# Patient Record
Sex: Female | Born: 1950 | Race: White | Hispanic: No | Marital: Married | Smoking: Never smoker
Health system: Southern US, Community
[De-identification: ages and names within clinical notes are randomized; demographics above are authoritative.]

## PROBLEM LIST (undated history)

## (undated) DIAGNOSIS — M810 Age-related osteoporosis without current pathological fracture: Secondary | ICD-10-CM

## (undated) DIAGNOSIS — Z789 Other specified health status: Secondary | ICD-10-CM

## (undated) HISTORY — PX: WRIST FRACTURE SURGERY: SHX121

## (undated) HISTORY — DX: Age-related osteoporosis without current pathological fracture: M81.0

## (undated) HISTORY — PX: DILATION AND CURETTAGE OF UTERUS: SHX78

---

## 2005-01-13 ENCOUNTER — Ambulatory Visit: Payer: Self-pay | Admitting: Obstetrics and Gynecology

## 2007-01-11 ENCOUNTER — Ambulatory Visit: Payer: Self-pay | Admitting: Obstetrics and Gynecology

## 2008-05-19 ENCOUNTER — Ambulatory Visit: Payer: Self-pay | Admitting: Family Medicine

## 2009-06-12 ENCOUNTER — Inpatient Hospital Stay (HOSPITAL_COMMUNITY): Admission: EM | Admit: 2009-06-12 | Discharge: 2009-06-14 | Payer: Self-pay | Admitting: Emergency Medicine

## 2009-06-12 ENCOUNTER — Encounter: Payer: Self-pay | Admitting: Emergency Medicine

## 2009-10-15 ENCOUNTER — Ambulatory Visit: Payer: Self-pay | Admitting: Family Medicine

## 2009-10-27 ENCOUNTER — Ambulatory Visit: Payer: Self-pay | Admitting: Family Medicine

## 2010-04-29 ENCOUNTER — Ambulatory Visit: Payer: Self-pay | Admitting: Family Medicine

## 2010-10-27 LAB — DIFFERENTIAL
Eosinophils Relative: 1 % (ref 0–5)
Lymphocytes Relative: 15 % (ref 12–46)
Lymphs Abs: 1.2 10*3/uL (ref 0.7–4.0)
Monocytes Absolute: 0.4 10*3/uL (ref 0.1–1.0)
Monocytes Relative: 4 % (ref 3–12)

## 2010-10-27 LAB — CBC
HCT: 38.4 % (ref 36.0–46.0)
Hemoglobin: 13.2 g/dL (ref 12.0–15.0)
RBC: 4.41 MIL/uL (ref 3.87–5.11)
RDW: 12.9 % (ref 11.5–15.5)
WBC: 8.5 10*3/uL (ref 4.0–10.5)

## 2010-10-27 LAB — BASIC METABOLIC PANEL
GFR calc Af Amer: >60 mL/min (ref >60)
GFR calc non Af Amer: >60 mL/min (ref >60)
Glucose, Bld: 113 mg/dL — ABNORMAL HIGH (ref 70–99)
Potassium: 3.6 mEq/L (ref 3.5–5.1)
Sodium: 140 mEq/L (ref 135–145)

## 2010-10-27 LAB — URINALYSIS, ROUTINE W REFLEX MICROSCOPIC
Bilirubin Urine: NEGATIVE
Hgb urine dipstick: NEGATIVE
Specific Gravity, Urine: 1.01 (ref 1.005–1.030)
Urobilinogen, UA: 0.2 mg/dL (ref 0.0–1.0)

## 2010-10-27 LAB — URINE CULTURE: Colony Count: 50000

## 2010-10-27 LAB — URINE MICROSCOPIC-ADD ON

## 2011-01-19 ENCOUNTER — Other Ambulatory Visit (HOSPITAL_COMMUNITY): Payer: Self-pay | Admitting: Internal Medicine

## 2011-01-19 DIAGNOSIS — Z01419 Encounter for gynecological examination (general) (routine) without abnormal findings: Secondary | ICD-10-CM

## 2011-01-24 ENCOUNTER — Other Ambulatory Visit (HOSPITAL_COMMUNITY): Payer: Self-pay

## 2011-01-28 ENCOUNTER — Ambulatory Visit (HOSPITAL_COMMUNITY)
Admission: RE | Admit: 2011-01-28 | Discharge: 2011-01-28 | Disposition: A | Discharge status: Home / Self Care | Payer: 59 | Source: Ambulatory Visit | Attending: Internal Medicine | Admitting: Internal Medicine

## 2011-01-28 DIAGNOSIS — Z780 Asymptomatic menopausal state: Secondary | ICD-10-CM | POA: Insufficient documentation

## 2011-01-28 DIAGNOSIS — Z13820 Encounter for screening for osteoporosis: Secondary | ICD-10-CM | POA: Insufficient documentation

## 2011-01-28 DIAGNOSIS — M818 Other osteoporosis without current pathological fracture: Secondary | ICD-10-CM | POA: Insufficient documentation

## 2011-01-28 DIAGNOSIS — Z01419 Encounter for gynecological examination (general) (routine) without abnormal findings: Secondary | ICD-10-CM

## 2011-06-03 ENCOUNTER — Other Ambulatory Visit (HOSPITAL_COMMUNITY)
Admission: RE | Admit: 2011-06-03 | Discharge: 2011-06-03 | Disposition: A | Discharge status: Home / Self Care | Payer: 59 | Source: Ambulatory Visit | Attending: Obstetrics & Gynecology | Admitting: Obstetrics & Gynecology

## 2011-06-03 DIAGNOSIS — Z01419 Encounter for gynecological examination (general) (routine) without abnormal findings: Secondary | ICD-10-CM | POA: Insufficient documentation

## 2011-06-15 ENCOUNTER — Ambulatory Visit: Payer: Self-pay

## 2012-12-12 ENCOUNTER — Encounter (INDEPENDENT_AMBULATORY_CARE_PROVIDER_SITE_OTHER): Payer: Self-pay | Admitting: *Deleted

## 2012-12-13 ENCOUNTER — Encounter: Payer: Self-pay | Admitting: Family Medicine

## 2012-12-19 ENCOUNTER — Other Ambulatory Visit (INDEPENDENT_AMBULATORY_CARE_PROVIDER_SITE_OTHER): Payer: Self-pay | Admitting: *Deleted

## 2012-12-19 ENCOUNTER — Telehealth (INDEPENDENT_AMBULATORY_CARE_PROVIDER_SITE_OTHER): Payer: Self-pay | Admitting: *Deleted

## 2012-12-19 ENCOUNTER — Encounter (INDEPENDENT_AMBULATORY_CARE_PROVIDER_SITE_OTHER): Payer: Self-pay | Admitting: *Deleted

## 2012-12-19 DIAGNOSIS — Z1211 Encounter for screening for malignant neoplasm of colon: Secondary | ICD-10-CM

## 2012-12-19 LAB — HM COLONOSCOPY

## 2012-12-19 NOTE — Telephone Encounter (Signed)
Patient needs movi prep 

## 2012-12-20 MED ORDER — PEG-KCL-NACL-NASULF-NA ASC-C 100 G PO SOLR: 1.0000 | Freq: Once | ORAL | Status: DC

## 2013-01-08 ENCOUNTER — Telehealth (INDEPENDENT_AMBULATORY_CARE_PROVIDER_SITE_OTHER): Payer: Self-pay | Admitting: *Deleted

## 2013-01-08 NOTE — Telephone Encounter (Signed)
agree

## 2013-01-08 NOTE — Telephone Encounter (Signed)
  Procedure: tcs  Reason/Indication:  screening  Has patient had this procedure before?  Yes, 10 yrs ago  If so, when, by whom and where?    Is there a family history of colon cancer?  Not sure  Who?  What age when diagnosed?    Is patient diabetic?   no      Does patient have prosthetic heart valve?  no  Do you have a pacemaker?  no  Has patient ever had endocarditis? no  Has patient had joint replacement within last 12 months?  no  Is patient on Coumadin, Plavix and/or Aspirin? no  Medications: ulta omega daily, co q 10 daily, one a day vit daily, calcium w/ vit d3 daily  Allergies: clindamycin  Medication Adjustment:   Procedure date & time: 02/06/13 at 930

## 2013-01-18 ENCOUNTER — Encounter (HOSPITAL_COMMUNITY): Payer: Self-pay | Admitting: Pharmacy Technician

## 2013-02-06 ENCOUNTER — Ambulatory Visit (HOSPITAL_COMMUNITY)
Admission: RE | Admit: 2013-02-06 | Discharge: 2013-02-06 | Disposition: A | Discharge status: Home / Self Care | Payer: 59 | Source: Ambulatory Visit | Attending: Internal Medicine | Admitting: Internal Medicine

## 2013-02-06 ENCOUNTER — Encounter (HOSPITAL_COMMUNITY)
Admission: RE | Disposition: A | Discharge status: Home / Self Care | Payer: Self-pay | Source: Ambulatory Visit | Attending: Internal Medicine

## 2013-02-06 ENCOUNTER — Encounter (HOSPITAL_COMMUNITY): Payer: Self-pay

## 2013-02-06 DIAGNOSIS — Z1211 Encounter for screening for malignant neoplasm of colon: Secondary | ICD-10-CM

## 2013-02-06 DIAGNOSIS — K5730 Diverticulosis of large intestine without perforation or abscess without bleeding: Secondary | ICD-10-CM | POA: Insufficient documentation

## 2013-02-06 HISTORY — PX: COLONOSCOPY: SHX5424

## 2013-02-06 HISTORY — DX: Other specified health status: Z78.9

## 2013-02-06 SURGERY — COLONOSCOPY
Anesthesia: Moderate Sedation

## 2013-02-06 MED ORDER — SODIUM CHLORIDE 0.9 % IV SOLN
INTRAVENOUS | Status: DC
  Administered 2013-02-06: 09:00:00 via INTRAVENOUS

## 2013-02-06 MED ORDER — MEPERIDINE HCL 50 MG/ML IJ SOLN
INTRAMUSCULAR | Status: DC | PRN
  Administered 2013-02-06 (×2): 25 mg via INTRAVENOUS

## 2013-02-06 MED ORDER — STERILE WATER FOR IRRIGATION IR SOLN
Status: DC | PRN
  Administered 2013-02-06: 10:00:00

## 2013-02-06 MED ORDER — MIDAZOLAM HCL 5 MG/5ML IJ SOLN
INTRAMUSCULAR | Status: AC
  Filled 2013-02-06: qty 10

## 2013-02-06 MED ORDER — MIDAZOLAM HCL 5 MG/5ML IJ SOLN
INTRAMUSCULAR | Status: DC | PRN
  Administered 2013-02-06 (×2): 2 mg via INTRAVENOUS
  Administered 2013-02-06 (×2): 1 mg via INTRAVENOUS

## 2013-02-06 MED ORDER — MEPERIDINE HCL 50 MG/ML IJ SOLN
INTRAMUSCULAR | Status: AC
  Filled 2013-02-06: qty 1

## 2013-02-06 NOTE — H&P (Signed)
Joan Jarvis is an 62 y.o. female.   Chief Complaint: Patient is here for colonoscopy. HPI: Patient is 62-year-old Caucasian female who is in for screening colonoscopy. Patient's last exam was 10 years ago and was normal. She denies abdominal pain change in bowel habits or rectal bleeding. Family history is significant for colon carcinoma in one of her aunts was in her 12s at the time of diagnosis.  Past Medical History  Diagnosis Date  . Medical history non-contributory     Past Surgical History  Procedure Laterality Date  . Wrist fracture surgery Left     2010  . Dilation and curettage of uterus  30years ago    History reviewed. No pertinent family history. Social History:  reports that she does not drink alcohol or use illicit drugs. Her tobacco history is not on file.  Allergies:  Allergies  Allergen Reactions  . Clindamycin/Lincomycin Rash    Medications Prior to Admission  Medication Sig Dispense Refill  . calcium-vitamin D (OSCAL WITH D) 500-200 MG-UNIT per tablet Take 1 tablet by mouth 2 (two) times daily.      . Coenzyme Q10 (COQ10) 100 MG CAPS Take 1 tablet by mouth daily.      . Multiple Vitamins-Minerals (MULTIVITAMINS THER. W/MINERALS) TABS Take 1 tablet by mouth daily.      Marland Kitchen OVER THE COUNTER MEDICATION Take 3-4 capsules by mouth daily. Nordic naturals ultimate Omega supplement. 1,000 mg per capsule. Takes 3-4 daily      . triamcinolone (NASACORT AQ) 55 MCG/ACT nasal inhaler Place 1-2 sprays into the nose daily.        No results found for this or any previous visit (from the past 48 hour(s)). No results found.  ROS  Blood pressure 132/63, pulse 72, temperature 97.8 F (36.6 C), temperature source Oral, resp. rate 14, height 5\' 2"  (1.575 m), weight 145 lb (65.772 kg), SpO2 98.00%. Physical Exam  Constitutional: She appears well-developed and well-nourished.  HENT:  Mouth/Throat: Oropharynx is clear and moist.  Eyes: Conjunctivae are normal.  Neck: No  thyromegaly present.  Cardiovascular: Normal rate, regular rhythm and normal heart sounds.   No murmur heard. Respiratory: Effort normal and breath sounds normal.  GI: Soft. She exhibits no distension and no mass. There is no tenderness.  Musculoskeletal: She exhibits no edema.  Lymphadenopathy:    She has no cervical adenopathy.  Neurological: She is alert.  Skin: Skin is warm and dry.     Assessment/Plan Average risk screening colonoscopy.  Joan Jarvis U 02/06/2013, 9:54 AM

## 2013-02-06 NOTE — Op Note (Signed)
COLONOSCOPY PROCEDURE REPORT  PATIENT:  Joan Jarvis  MR#:  [id number] Birthdate:  03/30/1951, 62 y.o., female Endoscopist:  Dr. Malissa Hippo, MD Referred By:  Dr. Oneal Deputy. Juanetta Gosling, MD  Procedure Date: 02/06/2013  Procedure:   Colonoscopy  Indications:  Patient is 62 year old Caucasian female who is undergoing average risk screening colonoscopy. Patient's last exam was 10 years ago and was normal.  Informed Consent:  The procedure and risks were reviewed with the patient and informed consent was obtained.  Medications:  Demerol 50 mg IV Versed 6 mg IV  Description of procedure:  After a digital rectal exam was performed, that colonoscope was advanced from the anus through the rectum and colon to the area of the cecum, ileocecal valve and appendiceal orifice. The cecum was deeply intubated. These structures were well-seen and photographed for the record. From the level of the cecum and ileocecal valve, the scope was slowly and cautiously withdrawn. The mucosal surfaces were carefully surveyed utilizing scope tip to flexion to facilitate fold flattening as needed. The scope was pulled down into the rectum where a thorough exam including retroflexion was performed.  Findings:   Prep excellent Single small diverticulum at sigmoid colon. No polyps or other mucosal abnormalities noted. Normal rectal mucosa and anorectal junction.    Therapeutic/Diagnostic Maneuvers Performed:  None  Complications:  None  Cecal Withdrawal Time:  8 minutes  Impression:  Single small diverticulum at sigmoid colon otherwise normal colonoscopy.  Recommendations:  Standard instructions given. Next screening exam in 10 years.  Yesha Muchow U  02/06/2013 10:34 AM  CC: Dr. Fredirick Maudlin, MD & Dr. Bonnetta Barry ref. provider found

## 2013-02-07 ENCOUNTER — Encounter (HOSPITAL_COMMUNITY): Payer: Self-pay | Admitting: Internal Medicine

## 2013-03-08 ENCOUNTER — Other Ambulatory Visit (HOSPITAL_COMMUNITY): Payer: Self-pay | Admitting: Pulmonary Disease

## 2013-03-08 DIAGNOSIS — M1990 Unspecified osteoarthritis, unspecified site: Secondary | ICD-10-CM

## 2013-04-03 ENCOUNTER — Ambulatory Visit (HOSPITAL_COMMUNITY)
Admission: RE | Admit: 2013-04-03 | Discharge: 2013-04-03 | Disposition: A | Discharge status: Home / Self Care | Payer: 59 | Source: Ambulatory Visit | Attending: Pulmonary Disease | Admitting: Pulmonary Disease

## 2013-04-03 DIAGNOSIS — M818 Other osteoporosis without current pathological fracture: Secondary | ICD-10-CM | POA: Insufficient documentation

## 2013-04-03 DIAGNOSIS — M1990 Unspecified osteoarthritis, unspecified site: Secondary | ICD-10-CM

## 2013-04-03 LAB — HM DEXA SCAN

## 2013-06-14 ENCOUNTER — Other Ambulatory Visit: Payer: Self-pay | Admitting: Obstetrics & Gynecology

## 2013-07-01 ENCOUNTER — Other Ambulatory Visit: Payer: Self-pay | Admitting: Obstetrics & Gynecology

## 2013-07-04 ENCOUNTER — Encounter: Payer: Self-pay | Admitting: Obstetrics & Gynecology

## 2013-07-04 ENCOUNTER — Encounter (INDEPENDENT_AMBULATORY_CARE_PROVIDER_SITE_OTHER): Payer: Self-pay

## 2013-07-04 ENCOUNTER — Ambulatory Visit (INDEPENDENT_AMBULATORY_CARE_PROVIDER_SITE_OTHER): Payer: 59 | Admitting: Obstetrics & Gynecology

## 2013-07-04 ENCOUNTER — Other Ambulatory Visit (HOSPITAL_COMMUNITY)
Admission: RE | Admit: 2013-07-04 | Discharge: 2013-07-04 | Disposition: A | Discharge status: Home / Self Care | Payer: 59 | Source: Ambulatory Visit | Attending: Obstetrics & Gynecology | Admitting: Obstetrics & Gynecology

## 2013-07-04 ENCOUNTER — Other Ambulatory Visit: Payer: Self-pay | Admitting: Obstetrics & Gynecology

## 2013-07-04 VITALS — BP 144/80 | Ht 62.0 in | Wt 150.5 lb

## 2013-07-04 DIAGNOSIS — Z1151 Encounter for screening for human papillomavirus (HPV): Secondary | ICD-10-CM | POA: Insufficient documentation

## 2013-07-04 DIAGNOSIS — Z01419 Encounter for gynecological examination (general) (routine) without abnormal findings: Secondary | ICD-10-CM

## 2013-07-04 NOTE — Progress Notes (Signed)
Patient ID: [name], female   DOB: September 16, 1950, 62 y.o.   MRN: [id number] Subjective:     Joan Jarvis is a 62 y.o. female here for a routine exam.  No LMP recorded. Patient is postmenopausal. No obstetric history on file. Birth Control Method:  na Menstrual Calendar(currently): na  Current complaints: na.   Current acute medical issues:  none   Recent Gynecologic History No LMP recorded. Patient is postmenopausal. Last Pap: 2013,  normal Last mammogram: 2014,  normal  Past Medical History  Diagnosis Date  . Medical history non-contributory   . Osteoporosis     Past Surgical History  Procedure Laterality Date  . Wrist fracture surgery Left     2010  . Dilation and curettage of uterus  30years ago  . Colonoscopy N/A 02/06/2013    Procedure: COLONOSCOPY;  Surgeon: Malissa Hippo, MD;  Location: [facility];  Service: Endoscopy;  Laterality: N/A;  930    OB History   Grav Para Term Preterm Abortions TAB SAB Ect Mult Living                  History   Social History  . Marital Status: Legally Separated    Spouse Name: N/A    Number of Children: N/A  . Years of Education: N/A   Social History Main Topics  . Smoking status: Never Smoker   . Smokeless tobacco: Never Used  . Alcohol Use: No  . Drug Use: No  . Sexual Activity: Not Currently   Other Topics Concern  . None   Social History Narrative  . None    Family History  Problem Relation Age of Onset  . Heart disease Mother     valve replacement  . Heart disease Father     heart attack  . Stroke Father   . Hypertension Sister      Review of Systems  Review of Systems  Constitutional: Negative for fever, chills, weight loss, malaise/fatigue and diaphoresis.  HENT: Negative for hearing loss, ear pain, nosebleeds, congestion, sore throat, neck pain, tinnitus and ear discharge.   Eyes: Negative for blurred vision, double vision, photophobia, pain, discharge and redness.  Respiratory: Negative  for cough, hemoptysis, sputum production, shortness of breath, wheezing and stridor.   Cardiovascular: Negative for chest pain, palpitations, orthopnea, claudication, leg swelling and PND.  Gastrointestinal: negative for abdominal pain. Negative for heartburn, nausea, vomiting, diarrhea, constipation, blood in stool and melena.  Genitourinary: Negative for dysuria, urgency, frequency, hematuria and flank pain.  Musculoskeletal: Negative for myalgias, back pain, joint pain and falls.  Skin: Negative for itching and rash.  Neurological: Negative for dizziness, tingling, tremors, sensory change, speech change, focal weakness, seizures, loss of consciousness, weakness and headaches.  Endo/Heme/Allergies: Negative for environmental allergies and polydipsia. Does not bruise/bleed easily.  Psychiatric/Behavioral: Negative for depression, suicidal ideas, hallucinations, memory loss and substance abuse. The patient is not nervous/anxious and does not have insomnia.        Objective:    Physical Exam  Vitals reviewed. Constitutional: She is oriented to person, place, and time. She appears well-developed and well-nourished.  HENT:  Head: Normocephalic and atraumatic.        Right Ear: External ear normal.  Left Ear: External ear normal.  Nose: Nose normal.  Mouth/Throat: Oropharynx is clear and moist.  Eyes: Conjunctivae and EOM are normal. Pupils are equal, round, and reactive to light. Right eye exhibits no discharge. Left eye exhibits no discharge. No scleral  icterus.  Neck: Normal range of motion. Neck supple. No tracheal deviation present. No thyromegaly present.  Cardiovascular: Normal rate, regular rhythm, normal heart sounds and intact distal pulses.  Exam reveals no gallop and no friction rub.   No murmur heard. Respiratory: Effort normal and breath sounds normal. No respiratory distress. She has no wheezes. She has no rales. She exhibits no tenderness.  GI: Soft. Bowel sounds are normal.  She exhibits no distension and no mass. There is no tenderness. There is no rebound and no guarding.  Genitourinary:  Breasts no masses skin changes or nipple changes bilaterally      Vulva is normal without lesions Vagina is pink moist without discharge Cervix normal in appearance and pap is done Uterus is normal size shape and contour Adnexa is negative with normal sized ovaries   Musculoskeletal: Normal range of motion. She exhibits no edema and no tenderness.  Neurological: She is alert and oriented to person, place, and time. She has normal reflexes. She displays normal reflexes. No cranial nerve deficit. She exhibits normal muscle tone. Coordination normal.  Skin: Skin is warm and dry. No rash noted. No erythema. No pallor.  Psychiatric: She has a normal mood and affect. Her behavior is normal. Judgment and thought content normal.       Assessment:    Healthy female exam.    Plan:    Follow up in: 1 year.

## 2017-05-11 LAB — HM MAMMOGRAPHY

## 2017-06-19 ENCOUNTER — Encounter: Payer: Self-pay | Admitting: Family Medicine

## 2017-06-19 DIAGNOSIS — M129 Arthropathy, unspecified: Secondary | ICD-10-CM | POA: Diagnosis not present

## 2017-06-19 DIAGNOSIS — Z0000 Encounter for general adult medical examination without abnormal findings: Secondary | ICD-10-CM | POA: Diagnosis not present

## 2017-06-19 DIAGNOSIS — M810 Age-related osteoporosis without current pathological fracture: Secondary | ICD-10-CM | POA: Diagnosis not present

## 2017-06-19 DIAGNOSIS — Z23 Encounter for immunization: Secondary | ICD-10-CM | POA: Diagnosis not present

## 2017-07-26 DIAGNOSIS — M5033 Other cervical disc degeneration, cervicothoracic region: Secondary | ICD-10-CM | POA: Diagnosis not present

## 2017-07-26 DIAGNOSIS — M9901 Segmental and somatic dysfunction of cervical region: Secondary | ICD-10-CM | POA: Diagnosis not present

## 2017-08-02 DIAGNOSIS — M5033 Other cervical disc degeneration, cervicothoracic region: Secondary | ICD-10-CM | POA: Diagnosis not present

## 2017-08-02 DIAGNOSIS — M9901 Segmental and somatic dysfunction of cervical region: Secondary | ICD-10-CM | POA: Diagnosis not present

## 2017-08-17 DIAGNOSIS — M9901 Segmental and somatic dysfunction of cervical region: Secondary | ICD-10-CM | POA: Diagnosis not present

## 2017-08-17 DIAGNOSIS — M5033 Other cervical disc degeneration, cervicothoracic region: Secondary | ICD-10-CM | POA: Diagnosis not present

## 2018-03-09 ENCOUNTER — Other Ambulatory Visit: Payer: Self-pay | Admitting: Obstetrics & Gynecology

## 2018-03-09 DIAGNOSIS — Z1231 Encounter for screening mammogram for malignant neoplasm of breast: Secondary | ICD-10-CM

## 2018-04-12 DIAGNOSIS — H04121 Dry eye syndrome of right lacrimal gland: Secondary | ICD-10-CM | POA: Diagnosis not present

## 2018-04-12 DIAGNOSIS — H524 Presbyopia: Secondary | ICD-10-CM | POA: Diagnosis not present

## 2018-05-11 ENCOUNTER — Ambulatory Visit (HOSPITAL_COMMUNITY)
Admission: RE | Admit: 2018-05-11 | Discharge: 2018-05-11 | Disposition: A | Discharge status: Home / Self Care | Payer: Medicare Other | Source: Ambulatory Visit | Attending: Obstetrics & Gynecology | Admitting: Obstetrics & Gynecology

## 2018-05-11 DIAGNOSIS — Z1231 Encounter for screening mammogram for malignant neoplasm of breast: Secondary | ICD-10-CM | POA: Diagnosis not present

## 2018-05-14 ENCOUNTER — Ambulatory Visit: Payer: Medicare Other | Admitting: Obstetrics & Gynecology

## 2018-05-14 ENCOUNTER — Encounter: Payer: Self-pay | Admitting: Obstetrics & Gynecology

## 2018-05-14 ENCOUNTER — Other Ambulatory Visit (HOSPITAL_COMMUNITY)
Admission: RE | Admit: 2018-05-14 | Discharge: 2018-05-14 | Disposition: A | Discharge status: Home / Self Care | Payer: Medicare Other | Source: Ambulatory Visit | Attending: Obstetrics & Gynecology | Admitting: Obstetrics & Gynecology

## 2018-05-14 VITALS — BP 150/74 | HR 74 | Ht 62.0 in | Wt 145.0 lb

## 2018-05-14 DIAGNOSIS — Z1212 Encounter for screening for malignant neoplasm of rectum: Secondary | ICD-10-CM

## 2018-05-14 DIAGNOSIS — Z01419 Encounter for gynecological examination (general) (routine) without abnormal findings: Secondary | ICD-10-CM | POA: Insufficient documentation

## 2018-05-14 DIAGNOSIS — Z1211 Encounter for screening for malignant neoplasm of colon: Secondary | ICD-10-CM

## 2018-05-14 NOTE — Progress Notes (Signed)
Subjective:     Joan Jarvis is a 67 y.o. female here for a routine exam.  No LMP recorded. Patient is postmenopausal. G2P1001 Birth Control Method:  menopausal Menstrual Calendar(currently): amenorrheic  Current complaints: none.   Current acute medical issues:  none   Recent Gynecologic History No LMP recorded. Patient is postmenopausal. Last Pap: 2017,  normal Last mammogram: 2019,  normal  Past Medical History:  Diagnosis Date  . Medical history non-contributory   . Osteoporosis     Past Surgical History:  Procedure Laterality Date  . COLONOSCOPY N/A 02/06/2013   Procedure: COLONOSCOPY;  Surgeon: Malissa Hippo, MD;  Location: [facility];  Service: Endoscopy;  Laterality: N/A;  930  . DILATION AND CURETTAGE OF UTERUS  30years ago  . WRIST FRACTURE SURGERY Left    2010    OB History    Gravida  2   Para  1   Term  1   Preterm      AB      Living  1     SAB      TAB      Ectopic      Multiple      Live Births              Social History   Socioeconomic History  . Marital status: Married    Spouse name: Not on file  . Number of children: Not on file  . Years of education: Not on file  . Highest education level: Not on file  Occupational History  . Not on file  Social Needs  . Financial resource strain: Not on file  . Food insecurity:    Worry: Not on file    Inability: Not on file  . Transportation needs:    Medical: Not on file    Non-medical: Not on file  Tobacco Use  . Smoking status: Never Smoker  . Smokeless tobacco: Never Used  Substance and Sexual Activity  . Alcohol use: No  . Drug use: No  . Sexual activity: Not Currently  Lifestyle  . Physical activity:    Days per week: Not on file    Minutes per session: Not on file  . Stress: Not on file  Relationships  . Social connections:    Talks on phone: Not on file    Gets together: Not on file    Attends religious service: Not on file    Active member of club or  organization: Not on file    Attends meetings of clubs or organizations: Not on file    Relationship status: Not on file  Other Topics Concern  . Not on file  Social History Narrative  . Not on file    Family History  Problem Relation Age of Onset  . Heart disease Mother        valve replacement  . Heart disease Father        heart attack  . Stroke Father   . Hypertension Sister      Current Outpatient Medications:  .  calcium-vitamin D (OSCAL WITH D) 500-200 MG-UNIT per tablet, Take 1 tablet by mouth 2 (two) times daily., Disp: , Rfl:  .  Coenzyme Q10 (COQ10) 100 MG CAPS, Take 1 tablet by mouth daily., Disp: , Rfl:  .  Multiple Vitamins-Minerals (MULTIVITAMINS THER. W/MINERALS) TABS, Take 1 tablet by mouth daily. Centrum silver, Disp: , Rfl:  .  Omega-3 Fatty Acids (FISH OIL) 1000 MG CAPS, Take  3 capsules by mouth daily., Disp: , Rfl:  .  OVER THE COUNTER MEDICATION, Take 3-4 capsules by mouth daily. Nordic naturals ultimate Omega supplement. 1,000 mg per capsule. Takes 3-4 daily, Disp: , Rfl:  .  triamcinolone (NASACORT AQ) 55 MCG/ACT nasal inhaler, Place 1-2 sprays into the nose daily., Disp: , Rfl:   Review of Systems  Review of Systems  Constitutional: Negative for fever, chills, weight loss, malaise/fatigue and diaphoresis.  HENT: Negative for hearing loss, ear pain, nosebleeds, congestion, sore throat, neck pain, tinnitus and ear discharge.   Eyes: Negative for blurred vision, double vision, photophobia, pain, discharge and redness.  Respiratory: Negative for cough, hemoptysis, sputum production, shortness of breath, wheezing and stridor.   Cardiovascular: Negative for chest pain, palpitations, orthopnea, claudication, leg swelling and PND.  Gastrointestinal: negative for abdominal pain. Negative for heartburn, nausea, vomiting, diarrhea, constipation, blood in stool and melena.  Genitourinary: Negative for dysuria, urgency, frequency, hematuria and flank pain.   Musculoskeletal: Negative for myalgias, back pain, joint pain and falls.  Skin: Negative for itching and rash.  Neurological: Negative for dizziness, tingling, tremors, sensory change, speech change, focal weakness, seizures, loss of consciousness, weakness and headaches.  Endo/Heme/Allergies: Negative for environmental allergies and polydipsia. Does not bruise/bleed easily.  Psychiatric/Behavioral: Negative for depression, suicidal ideas, hallucinations, memory loss and substance abuse. The patient is not nervous/anxious and does not have insomnia.        Objective:  Blood pressure (!) 150/74, pulse 74, height 5\' 2"  (1.575 m), weight 145 lb (65.8 kg).   Physical Exam  Vitals reviewed. Constitutional: She is oriented to person, place, and time. She appears well-developed and well-nourished.  HENT:  Head: Normocephalic and atraumatic.        Right Ear: External ear normal.  Left Ear: External ear normal.  Nose: Nose normal.  Mouth/Throat: Oropharynx is clear and moist.  Eyes: Conjunctivae and EOM are normal. Pupils are equal, round, and reactive to light. Right eye exhibits no discharge. Left eye exhibits no discharge. No scleral icterus.  Neck: Normal range of motion. Neck supple. No tracheal deviation present. No thyromegaly present.  Cardiovascular: Normal rate, regular rhythm, normal heart sounds and intact distal pulses.  Exam reveals no gallop and no friction rub.   No murmur heard. Respiratory: Effort normal and breath sounds normal. No respiratory distress. She has no wheezes. She has no rales. She exhibits no tenderness.  GI: Soft. Bowel sounds are normal. She exhibits no distension and no mass. There is no tenderness. There is no rebound and no guarding.  Genitourinary:  Breasts no masses skin changes or nipple changes bilaterally      Vulva is normal without lesions Vagina is pink moist without discharge Cervix normal in appearance and pap is done Uterus is normal size shape  and contour Adnexa is negative with normal sized ovaries  {Rectal    hemoccult negative, normal tone, no masses  Musculoskeletal: Normal range of motion. She exhibits no edema and no tenderness.  Neurological: She is alert and oriented to person, place, and time. She has normal reflexes. She displays normal reflexes. No cranial nerve deficit. She exhibits normal muscle tone. Coordination normal.  Skin: Skin is warm and dry. No rash noted. No erythema. No pallor.  Psychiatric: She has a normal mood and affect. Her behavior is normal. Judgment and thought content normal.       Medications Ordered at today's visit: No orders of the defined types were placed in this encounter.  Other orders placed at today's visit: No orders of the defined types were placed in this encounter.     Assessment:    Healthy female exam.    Plan:         Return in about 3 years (around 05/14/2021) for yearly, with Dr Despina Hidden.

## 2018-05-16 LAB — CYTOLOGY - PAP
Diagnosis: NEGATIVE
HPV: NOT DETECTED

## 2019-05-29 DIAGNOSIS — M129 Arthropathy, unspecified: Secondary | ICD-10-CM | POA: Diagnosis not present

## 2019-05-29 DIAGNOSIS — J309 Allergic rhinitis, unspecified: Secondary | ICD-10-CM | POA: Diagnosis not present

## 2019-05-29 DIAGNOSIS — Z0000 Encounter for general adult medical examination without abnormal findings: Secondary | ICD-10-CM | POA: Diagnosis not present

## 2019-05-29 DIAGNOSIS — M810 Age-related osteoporosis without current pathological fracture: Secondary | ICD-10-CM | POA: Diagnosis not present

## 2019-05-29 LAB — TSH: TSH: 2.24 (ref <=5.90)

## 2019-05-29 LAB — HEPATIC FUNCTION PANEL
ALT: 16 (ref 7–35)
AST: 19 (ref 13–35)
Alkaline Phosphatase: 81 (ref 25–125)
Bilirubin, Total: 0.3

## 2019-05-29 LAB — LIPID PANEL
Cholesterol: 215 — AB (ref 0–200)
HDL: 62 (ref 35–70)
LDL Cholesterol: 138
Triglycerides: 86 (ref 40–160)

## 2019-05-29 LAB — CBC AND DIFFERENTIAL
HCT: 43 (ref 36–46)
Hemoglobin: 14.3 (ref 12.0–16.0)
Neutrophils Absolute: 2
Platelets: 234 (ref 150–399)
WBC: 4.3

## 2019-05-29 LAB — BASIC METABOLIC PANEL
BUN: 10 (ref 4–21)
CO2: 25 — AB (ref 13–22)
Chloride: 104 (ref 99–108)
Creatinine: 0.7 (ref <=1.1)
Glucose: 88
Potassium: 4.5 (ref 3.4–5.3)
Sodium: 140 (ref 137–147)

## 2019-05-29 LAB — COMPREHENSIVE METABOLIC PANEL
Calcium: 9.2 (ref 8.7–10.7)
GFR calc Af Amer: 102
GFR calc non Af Amer: 88

## 2019-05-29 LAB — CBC: RBC: 4.83 (ref 3.87–5.11)

## 2019-05-30 DIAGNOSIS — Z23 Encounter for immunization: Secondary | ICD-10-CM | POA: Diagnosis not present

## 2019-05-30 DIAGNOSIS — Z0000 Encounter for general adult medical examination without abnormal findings: Secondary | ICD-10-CM | POA: Diagnosis not present

## 2019-11-14 ENCOUNTER — Other Ambulatory Visit: Payer: Self-pay

## 2019-11-14 ENCOUNTER — Ambulatory Visit (INDEPENDENT_AMBULATORY_CARE_PROVIDER_SITE_OTHER): Payer: Medicare Other | Admitting: Family Medicine

## 2019-11-14 ENCOUNTER — Encounter: Payer: Self-pay | Admitting: Family Medicine

## 2019-11-14 VITALS — BP 140/80 | HR 72 | Temp 98.0°F | Ht 62.5 in | Wt 143.2 lb

## 2019-11-14 DIAGNOSIS — M810 Age-related osteoporosis without current pathological fracture: Secondary | ICD-10-CM | POA: Diagnosis not present

## 2019-11-14 NOTE — Progress Notes (Signed)
New Patient Office Visit  Subjective:  Patient ID: Joan Jarvis, female    DOB: 10/04/1950  Age: 69 y.o. MRN: [id number]  CC:  Chief Complaint  Patient presents with  . Establish Care    here to est care and f/u. Would like to discuss the otc med she is currently taking    HPI [name] presents for DEXA-caltrate +Vit D  Past Medical History:  Diagnosis Date  . Medical history non-contributory   . Osteoporosis     Past Surgical History:  Procedure Laterality Date  . COLONOSCOPY N/A 02/06/2013   Procedure: COLONOSCOPY;  Surgeon: Malissa Hippo, MD;  Location: [facility];  Service: Endoscopy;  Laterality: N/A;  930  . DILATION AND CURETTAGE OF UTERUS  30years ago  . WRIST FRACTURE SURGERY Left    2010    Family History  Problem Relation Age of Onset  . Heart disease Mother        valve replacement  . Heart disease Father        heart attack  . Stroke Father   . Hypertension Sister     Social History   Socioeconomic History  . Marital status: Married    Spouse name: Not on file  . Number of children: Not on file  . Years of education: Not on file  . Highest education level: Not on file  Occupational History  . Not on file  Tobacco Use  . Smoking status: Never Smoker  . Smokeless tobacco: Never Used  Substance and Sexual Activity  . Alcohol use: No  . Drug use: No  . Sexual activity: Not Currently  Other Topics Concern  . Not on file  Social History Narrative  . Not on file   Social Determinants of Health   Financial Resource Strain:   . Difficulty of Paying Living Expenses:   Food Insecurity:   . Worried About Programme researcher, broadcasting/film/video in the Last Year:   . Barista in the Last Year:   Transportation Needs:   . Freight forwarder (Medical):   Marland Kitchen Lack of Transportation (Non-Medical):   Physical Activity:   . Days of Exercise per Week:   . Minutes of Exercise per Session:   Stress:   . Feeling of Stress :   Social Connections:    . Frequency of Communication with Friends and Family:   . Frequency of Social Gatherings with Friends and Family:   . Attends Religious Services:   . Active Member of Clubs or Organizations:   . Attends Banker Meetings:   Marland Kitchen Marital Status:   Intimate Partner Violence:   . Fear of Current or Ex-Partner:   . Emotionally Abused:   Marland Kitchen Physically Abused:   . Sexually Abused:     ROS Review of Systems  HENT: Negative.   Respiratory: Negative.   Cardiovascular: Negative.   Gastrointestinal: Negative.   Endocrine: Negative.   Genitourinary: Negative.   Musculoskeletal: Negative.     Objective:   Today's Vitals: BP 140/80 (BP Location: Right Arm, Patient Position: Sitting, Cuff Size: Normal)   Pulse 72   Temp 98 F (36.7 C) (Temporal)   Ht 5' 2.5" (1.588 m)   Wt 143 lb 3.2 oz (65 kg)   SpO2 98%   BMI 25.77 kg/m   Physical Exam Constitutional:      Appearance: Normal appearance. She is obese.  HENT:     Head: Normocephalic and atraumatic.  Right Ear: Tympanic membrane normal.     Left Ear: Tympanic membrane normal.  Cardiovascular:     Rate and Rhythm: Normal rate and regular rhythm.     Heart sounds: Normal heart sounds.  Pulmonary:     Effort: Pulmonary effort is normal.     Breath sounds: Normal breath sounds.  Musculoskeletal:     Cervical back: Normal range of motion and neck supple.  Neurological:     Mental Status: She is alert and oriented to person, place, and time.  Psychiatric:        Mood and Affect: Mood normal.        Behavior: Behavior normal.     Assessment & Plan:   1. Osteoporosis, unspecified osteoporosis type, unspecified pathological fracture presence Vit D+ Calcium  Outpatient Encounter Medications as of 11/14/2019  Medication Sig  . Calcium Carb-Cholecalciferol (CALTRATE 600+D3) 600-800 MG-UNIT TABS Take by mouth.  . Coenzyme Q10 (COQ10) 100 MG CAPS Take 1 tablet by mouth daily.  . Multiple Vitamins-Minerals  (MULTIVITAMINS THER. W/MINERALS) TABS Take 1 tablet by mouth daily. Centrum silver  . Omega-3 Fatty Acids (FISH OIL) 1000 MG CAPS Take 3 capsules by mouth daily.  Marland Kitchen OVER THE COUNTER MEDICATION Take 3-4 capsules by mouth daily. Nordic naturals ultimate Omega supplement. 1,000 mg per capsule. Takes 3-4 daily  . Probiotic Product (PROBIOTIC ACIDOPHILUS BIOBEADS) CAPS Take by mouth.  . triamcinolone (NASACORT AQ) 55 MCG/ACT nasal inhaler Place 1-2 sprays into the nose daily.  . [DISCONTINUED] calcium-vitamin D (OSCAL WITH D) 500-200 MG-UNIT per tablet Take 1 tablet by mouth 2 (two) times daily.   No facility-administered encounter medications on file as of 11/14/2019.    Follow-up:  Primary physician  For follow up  Ramondo Dietze Hannah Beat, MD

## 2019-11-14 NOTE — Patient Instructions (Addendum)
   Find primary physician  If you have lab work done today you will be contacted with your lab results within the next 2 weeks.  If you have not heard from [place] then please contact us. The fastest way to get your results is to register for My Chart.   IF you received an x-ray today, you will receive an invoice from [facility]. Please contact [facility] at [phone] with questions or concerns regarding your invoice.   IF you received labwork today, you will receive an invoice from [facility]. Please contact [facility] at [phone] with questions or concerns regarding your invoice.   Our billing staff will not be able to assist you with questions regarding bills from these companies.  You will be contacted with the lab results as soon as they are available. The fastest way to get your results is to activate your My Chart account. Instructions are located on the last page of this paperwork. If you have not heard from [place] regarding the results in 2 weeks, please contact this office.

## 2020-06-11 ENCOUNTER — Encounter: Payer: Self-pay | Admitting: Internal Medicine

## 2020-06-11 ENCOUNTER — Other Ambulatory Visit: Payer: Self-pay

## 2020-06-11 ENCOUNTER — Ambulatory Visit (INDEPENDENT_AMBULATORY_CARE_PROVIDER_SITE_OTHER): Payer: Medicare Other | Admitting: Internal Medicine

## 2020-06-11 VITALS — BP 155/82 | HR 63 | Temp 98.0°F | Resp 18 | Ht 63.0 in | Wt 148.4 lb

## 2020-06-11 DIAGNOSIS — M810 Age-related osteoporosis without current pathological fracture: Secondary | ICD-10-CM | POA: Diagnosis not present

## 2020-06-11 DIAGNOSIS — Z7689 Persons encountering health services in other specified circumstances: Secondary | ICD-10-CM | POA: Diagnosis not present

## 2020-06-11 DIAGNOSIS — Z0000 Encounter for general adult medical examination without abnormal findings: Secondary | ICD-10-CM | POA: Insufficient documentation

## 2020-06-11 DIAGNOSIS — Z1231 Encounter for screening mammogram for malignant neoplasm of breast: Secondary | ICD-10-CM

## 2020-06-11 DIAGNOSIS — I10 Essential (primary) hypertension: Secondary | ICD-10-CM

## 2020-06-11 NOTE — Assessment & Plan Note (Signed)
On calcium and Vit D supplements 

## 2020-06-11 NOTE — Assessment & Plan Note (Signed)
BP Readings from Last 1 Encounters:  06/11/20 (!) 155/82   New-onset Counseled for starting a medication, patient refuses to start it after a long discussion and wants to try lifestyle modification. Advised DASH diet and moderate exercise/walking, at least 150 mins/week

## 2020-06-11 NOTE — Assessment & Plan Note (Signed)
Care established Previous chart reviewed History and medications reviewed with the patient 

## 2020-06-11 NOTE — Progress Notes (Signed)
New Patient Office Visit  Subjective:  Patient ID: Joan Jarvis, female    DOB: May 27, 1951  Age: 69 y.o. MRN: [id number]  CC:  Chief Complaint  Patient presents with  . New Patient (Initial Visit)    new pt no issues establsihing care     HPI Joan Jarvis is a 68 year old female with PMH of osteoporosis who presents for establishing care.  She states that she has been in good health overall. She takes calcium and Vit D supplements for osteoporosis.  Her BP was 182/78 in the office initially, and 155/82 on repeat check. She denies any headache, dizziness, chest pain, dyspnea or palpitations. She had BP - 140/80 in the previous PCP office. She is counseled about the importance of starting a medication, but she refuses to start a medication for now and wants to try DASH diet and exercises. She used to be very regular with exercises in the past, but has not been very active because of the pandemic and attributes her high BP to it.  Last colonoscopy in 11/2012. Last Mammography in 04/2018.  Patient has had both doses of COVID vaccine. She wants to wait for flu and Tdap vaccine for now.  Past Medical History:  Diagnosis Date  . Medical history non-contributory   . Osteoporosis     Past Surgical History:  Procedure Laterality Date  . COLONOSCOPY N/A 02/06/2013   Procedure: COLONOSCOPY;  Surgeon: Rogene Houston, MD;  Location: [facility];  Service: Endoscopy;  Laterality: N/A;  930  . DILATION AND CURETTAGE OF UTERUS  30years ago  . WRIST FRACTURE SURGERY Left    2010    Family History  Problem Relation Age of Onset  . Heart disease Mother        valve replacement  . Heart disease Father        heart attack  . Stroke Father   . Hypertension Sister     Social History   Socioeconomic History  . Marital status: Married    Spouse name: Not on file  . Number of children: Not on file  . Years of education: Not on file  . Highest education level: Not on file   Occupational History  . Not on file  Tobacco Use  . Smoking status: Never Smoker  . Smokeless tobacco: Never Used  Substance and Sexual Activity  . Alcohol use: No  . Drug use: No  . Sexual activity: Not Currently  Other Topics Concern  . Not on file  Social History Narrative  . Not on file   Social Determinants of Health   Financial Resource Strain:   . Difficulty of Paying Living Expenses: Not on file  Food Insecurity:   . Worried About Charity fundraiser in the Last Year: Not on file  . Ran Out of Food in the Last Year: Not on file  Transportation Needs:   . Lack of Transportation (Medical): Not on file  . Lack of Transportation (Non-Medical): Not on file  Physical Activity:   . Days of Exercise per Week: Not on file  . Minutes of Exercise per Session: Not on file  Stress:   . Feeling of Stress : Not on file  Social Connections:   . Frequency of Communication with Friends and Family: Not on file  . Frequency of Social Gatherings with Friends and Family: Not on file  . Attends Religious Services: Not on file  . Active Member of Clubs or Organizations: Not on  file  . Attends Archivist Meetings: Not on file  . Marital Status: Not on file  Intimate Partner Violence:   . Fear of Current or Ex-Partner: Not on file  . Emotionally Abused: Not on file  . Physically Abused: Not on file  . Sexually Abused: Not on file    ROS Review of Systems  Constitutional: Negative for chills and fever.  HENT: Negative for congestion, sinus pressure, sinus pain and sore throat.   Eyes: Negative for pain and discharge.  Respiratory: Negative for cough and shortness of breath.   Cardiovascular: Negative for chest pain and palpitations.  Gastrointestinal: Negative for abdominal pain, constipation, diarrhea, nausea and vomiting.  Endocrine: Negative for polydipsia and polyuria.  Genitourinary: Negative for dysuria and hematuria.  Musculoskeletal: Negative for neck pain and  neck stiffness.  Skin: Negative for rash.  Neurological: Negative for dizziness and weakness.  Psychiatric/Behavioral: Negative for agitation and behavioral problems.    Objective:   Today's Vitals: BP (!) 155/82 (BP Location: Right Arm, Patient Position: Sitting)   Pulse 63   Temp 98 F (36.7 C) (Oral)   Resp 18   Ht '5\' 3"'  (1.6 m)   Wt 148 lb 6.4 oz (67.3 kg)   SpO2 98%   BMI 26.29 kg/m   Physical Exam Vitals reviewed.  Constitutional:      General: She is not in acute distress.    Appearance: She is not diaphoretic.  HENT:     Head: Normocephalic and atraumatic.     Nose: Nose normal.     Mouth/Throat:     Mouth: Mucous membranes are moist.  Eyes:     General: No scleral icterus.    Extraocular Movements: Extraocular movements intact.     Pupils: Pupils are equal, round, and reactive to light.  Cardiovascular:     Rate and Rhythm: Normal rate and regular rhythm.     Pulses: Normal pulses.     Heart sounds: Normal heart sounds. No murmur heard.   Pulmonary:     Breath sounds: Normal breath sounds. No wheezing or rales.  Abdominal:     Palpations: Abdomen is soft.     Tenderness: There is no abdominal tenderness.  Musculoskeletal:     Cervical back: Neck supple. No tenderness.     Right lower leg: No edema.     Left lower leg: No edema.  Skin:    General: Skin is warm.     Findings: No rash.  Neurological:     General: No focal deficit present.     Mental Status: She is alert and oriented to person, place, and time.  Psychiatric:        Mood and Affect: Mood normal.        Behavior: Behavior normal.     Assessment & Plan:   Problem List Items Addressed This Visit      Cardiovascular and Mediastinum   Primary hypertension    BP Readings from Last 1 Encounters:  06/11/20 (!) 155/82   New-onset Counseled for starting a medication, patient refuses to start it after a long discussion and wants to try lifestyle modification. Advised DASH diet and  moderate exercise/walking, at least 150 mins/week         Musculoskeletal and Integument   Osteoporosis    On calcium and Vit D supplements        Other   Encounter to establish care - Primary    Care established Previous chart reviewed History and medications  reviewed with the patient      Relevant Orders   CBC with Differential   CMP14+EGFR   HgB A1c   Lipid Profile   Vitamin D (25 hydroxy)    Other Visit Diagnoses    Screening mammogram for breast cancer       Relevant Orders   MM Digital Screening      Outpatient Encounter Medications as of 06/11/2020  Medication Sig  . Calcium Carb-Cholecalciferol (CALTRATE 600+D3) 600-800 MG-UNIT TABS Take by mouth.  . Coenzyme Q10 (COQ10) 100 MG CAPS Take 1 tablet by mouth daily.  . Multiple Vitamins-Minerals (MULTIVITAMINS THER. W/MINERALS) TABS Take 1 tablet by mouth daily. Centrum silver  . Omega-3 Fatty Acids (FISH OIL) 1000 MG CAPS Take 3 capsules by mouth daily.  Marland Kitchen OVER THE COUNTER MEDICATION Take 3-4 capsules by mouth daily. Nordic naturals ultimate Omega supplement. 1,000 mg per capsule. Takes 3-4 daily  . OVER THE COUNTER MEDICATION Innate response formulas women over 40 1 daily  . Probiotic Product (PROBIOTIC ACIDOPHILUS BIOBEADS) CAPS Take by mouth.  . triamcinolone (NASACORT AQ) 55 MCG/ACT nasal inhaler Place 1-2 sprays into the nose daily.   No facility-administered encounter medications on file as of 06/11/2020.    Follow-up: Return in about 4 weeks (around 07/09/2020) for Annual physical.   Lindell Spar, MD

## 2020-06-11 NOTE — Patient Instructions (Signed)
Please follow DASH diet and perform moderate exercise/walking at least 150 mins/week.  DASH stands for Dietary Approaches to Stop Hypertension. The DASH diet is a healthy-eating plan designed to help treat or prevent high blood pressure (hypertension).  The DASH diet includes foods that are rich in potassium, calcium and magnesium. These nutrients help control blood pressure. The diet limits foods that are high in sodium, saturated fat and added sugars.  Studies have shown that the DASH diet can lower blood pressure in as little as two weeks. The diet can also lower low-density lipoprotein (LDL or "bad") cholesterol levels in the blood. High blood pressure and high LDL cholesterol levels are two major risk factors for heart disease and stroke.    DASH diet: Recommended servings The DASH diet provides daily and weekly nutritional goals. The number of servings you should have depends on your daily calorie needs.  Here's a look at the recommended servings from each food group for a 2,000-calorie-a-day DASH diet:  Grains: 6 to 8 servings a day. One serving is one slice bread, 1 ounce dry cereal, or 1/2 cup cooked cereal, rice or pasta. Vegetables: 4 to 5 servings a day. One serving is 1 cup raw leafy green vegetable, 1/2 cup cut-up raw or cooked vegetables, or 1/2 cup vegetable juice. Fruits: 4 to 5 servings a day. One serving is one medium fruit, 1/2 cup fresh, frozen or canned fruit, or 1/2 cup fruit juice. Fat-free or low-fat dairy products: 2 to 3 servings a day. One serving is 1 cup milk or yogurt, or 1 1/2 ounces cheese. Lean meats, poultry and fish: six 1-ounce servings or fewer a day. One serving is 1 ounce cooked meat, poultry or fish, or 1 egg. Nuts, seeds and legumes: 4 to 5 servings a week. One serving is 1/3 cup nuts, 2 tablespoons peanut butter, 2 tablespoons seeds, or 1/2 cup cooked legumes (dried beans or peas). Fats and oils: 2 to 3 servings a day. One serving is 1 teaspoon soft  margarine, 1 teaspoon vegetable oil, 1 tablespoon mayonnaise or 2 tablespoons salad dressing. Sweets and added sugars: 5 servings or fewer a week. One serving is 1 tablespoon sugar, jelly or jam, 1/2 cup sorbet, or 1 cup lemonade. 

## 2020-06-12 ENCOUNTER — Ambulatory Visit: Payer: Medicare Other | Admitting: Internal Medicine

## 2020-07-01 DIAGNOSIS — H25013 Cortical age-related cataract, bilateral: Secondary | ICD-10-CM | POA: Diagnosis not present

## 2020-07-02 ENCOUNTER — Other Ambulatory Visit: Payer: Self-pay

## 2020-07-02 ENCOUNTER — Other Ambulatory Visit: Payer: Self-pay | Admitting: Internal Medicine

## 2020-07-02 DIAGNOSIS — R739 Hyperglycemia, unspecified: Secondary | ICD-10-CM | POA: Diagnosis not present

## 2020-07-02 DIAGNOSIS — E559 Vitamin D deficiency, unspecified: Secondary | ICD-10-CM | POA: Diagnosis not present

## 2020-07-02 DIAGNOSIS — Z7689 Persons encountering health services in other specified circumstances: Secondary | ICD-10-CM | POA: Diagnosis not present

## 2020-07-03 LAB — CMP14+EGFR
ALT: 14 IU/L (ref 0–32)
AST: 19 IU/L (ref 0–40)
Albumin/Globulin Ratio: 1.8 (ref 1.2–2.2)
Albumin: 4.2 g/dL (ref 3.8–4.8)
Alkaline Phosphatase: 73 IU/L (ref 44–121)
BUN/Creatinine Ratio: 8 — ABNORMAL LOW (ref 12–28)
BUN: 6 mg/dL — ABNORMAL LOW (ref 8–27)
Bilirubin Total: 0.3 mg/dL (ref 0.0–1.2)
CO2: 24 mmol/L (ref 20–29)
Calcium: 9.1 mg/dL (ref 8.7–10.3)
Chloride: 105 mmol/L (ref 96–106)
Creatinine, Ser: 0.71 mg/dL (ref 0.57–1.00)
GFR calc Af Amer: 101 mL/min/{1.73_m2} (ref >59)
GFR calc non Af Amer: 88 mL/min/{1.73_m2} (ref >59)
Globulin, Total: 2.3 g/dL (ref 1.5–4.5)
Glucose: 89 mg/dL (ref 65–99)
Potassium: 4.3 mmol/L (ref 3.5–5.2)
Sodium: 144 mmol/L (ref 134–144)
Total Protein: 6.5 g/dL (ref 6.0–8.5)

## 2020-07-03 LAB — HEMOGLOBIN A1C
Est. average glucose Bld gHb Est-mCnc: 108 mg/dL
Hgb A1c MFr Bld: 5.4 % (ref 4.8–5.6)

## 2020-07-03 LAB — CBC WITH DIFFERENTIAL/PLATELET
Basophils Absolute: 0 10*3/uL (ref 0.0–0.2)
Basos: 1 %
EOS (ABSOLUTE): 0.1 10*3/uL (ref 0.0–0.4)
Eos: 2 %
Hematocrit: 42.8 % (ref 34.0–46.6)
Hemoglobin: 14.8 g/dL (ref 11.1–15.9)
Immature Grans (Abs): 0 10*3/uL (ref 0.0–0.1)
Immature Granulocytes: 0 %
Lymphocytes Absolute: 1.6 10*3/uL (ref 0.7–3.1)
Lymphs: 38 %
MCH: 30.6 pg (ref 26.6–33.0)
MCHC: 34.6 g/dL (ref 31.5–35.7)
MCV: 89 fL (ref 79–97)
Monocytes Absolute: 0.3 10*3/uL (ref 0.1–0.9)
Monocytes: 7 %
Neutrophils Absolute: 2.2 10*3/uL (ref 1.4–7.0)
Neutrophils: 52 %
Platelets: 219 10*3/uL (ref 150–450)
RBC: 4.83 x10E6/uL (ref 3.77–5.28)
RDW: 12.5 % (ref 11.7–15.4)
WBC: 4.1 10*3/uL (ref 3.4–10.8)

## 2020-07-03 LAB — LIPID PANEL
Chol/HDL Ratio: 3.7 ratio (ref 0.0–4.4)
Cholesterol, Total: 194 mg/dL (ref 100–199)
HDL: 52 mg/dL (ref >39)
LDL Chol Calc (NIH): 129 mg/dL — ABNORMAL HIGH (ref 0–99)
Triglycerides: 71 mg/dL (ref 0–149)
VLDL Cholesterol Cal: 13 mg/dL (ref 5–40)

## 2020-07-03 LAB — VITAMIN D 25 HYDROXY (VIT D DEFICIENCY, FRACTURES): Vit D, 25-Hydroxy: 29.2 ng/mL — ABNORMAL LOW (ref 30.0–100.0)

## 2020-07-09 ENCOUNTER — Ambulatory Visit (INDEPENDENT_AMBULATORY_CARE_PROVIDER_SITE_OTHER): Payer: Medicare Other | Admitting: Internal Medicine

## 2020-07-09 ENCOUNTER — Encounter: Payer: Self-pay | Admitting: Internal Medicine

## 2020-07-09 VITALS — BP 155/87 | HR 87 | Temp 98.0°F | Resp 18 | Ht 63.0 in | Wt 141.8 lb

## 2020-07-09 DIAGNOSIS — Z0000 Encounter for general adult medical examination without abnormal findings: Secondary | ICD-10-CM | POA: Diagnosis not present

## 2020-07-09 DIAGNOSIS — E785 Hyperlipidemia, unspecified: Secondary | ICD-10-CM

## 2020-07-09 DIAGNOSIS — I10 Essential (primary) hypertension: Secondary | ICD-10-CM

## 2020-07-09 MED ORDER — LISINOPRIL 5 MG PO TABS: 5.0000 mg | ORAL_TABLET | Freq: Every day | ORAL | 1 refills | Status: AC

## 2020-07-09 NOTE — Progress Notes (Signed)
Established Patient Office Visit  Subjective:  Patient ID: Joan Jarvis, female    DOB: April 25, 1951  Age: 69 y.o. MRN: [id number]  CC:  Chief Complaint  Patient presents with  . Annual Exam    Annual exam has been keeping bp at home also has a little neck pain but sees the chiropractor    HPI Joan Jarvis is a 69 year old female with PMH of HTN and osteoporosis who presents for annual physical.  Patient has been doing well overall. She states that she has started following DASH diet and has been walking at least 30 mins/day. She has brought her BP readings from home, which is labile, from 120-170/70-90. Her BP readings have been high around 10 AM in the morning at home also. She denies any headache, dizziness, chest pain, dyspnea or palpitations. Of note, she takes coffee around this time. She has been stressed recently due to the Tax season (works for [employer]).  Mammography scheduled for tomorrow.  Past Medical History:  Diagnosis Date  . Medical history non-contributory   . Osteoporosis     Past Surgical History:  Procedure Laterality Date  . COLONOSCOPY N/A 02/06/2013   Procedure: COLONOSCOPY;  Surgeon: Malissa Hippo, MD;  Location: [facility];  Service: Endoscopy;  Laterality: N/A;  930  . DILATION AND CURETTAGE OF UTERUS  30years ago  . WRIST FRACTURE SURGERY Left    2010    Family History  Problem Relation Age of Onset  . Heart disease Mother        valve replacement  . Heart disease Father        heart attack  . Stroke Father   . Hypertension Sister     Social History   Socioeconomic History  . Marital status: Married    Spouse name: Not on file  . Number of children: Not on file  . Years of education: Not on file  . Highest education level: Not on file  Occupational History  . Not on file  Tobacco Use  . Smoking status: Never Smoker  . Smokeless tobacco: Never Used  Substance and Sexual Activity  . Alcohol use: No  . Drug use: No  .  Sexual activity: Not Currently  Other Topics Concern  . Not on file  Social History Narrative  . Not on file   Social Determinants of Health   Financial Resource Strain: Not on file  Food Insecurity: Not on file  Transportation Needs: Not on file  Physical Activity: Not on file  Stress: Not on file  Social Connections: Not on file  Intimate Partner Violence: Not on file    Outpatient Medications Prior to Visit  Medication Sig Dispense Refill  . Calcium Carb-Cholecalciferol (CALTRATE 600+D3) 600-800 MG-UNIT TABS Take by mouth.    . Coenzyme Q10 (COQ10) 100 MG CAPS Take 1 tablet by mouth daily.    . Multiple Vitamins-Minerals (MULTIVITAMINS THER. W/MINERALS) TABS Take 1 tablet by mouth daily. Centrum silver    . Omega-3 Fatty Acids (FISH OIL) 1000 MG CAPS Take 3 capsules by mouth daily.    Marland Kitchen OVER THE COUNTER MEDICATION Take 3-4 capsules by mouth daily. Nordic naturals ultimate Omega supplement. 1,000 mg per capsule. Takes 3-4 daily    . OVER THE COUNTER MEDICATION Innate response formulas women over 40 1 daily    . Probiotic Product (PROBIOTIC ACIDOPHILUS BIOBEADS) CAPS Take by mouth.    . triamcinolone (NASACORT) 55 MCG/ACT nasal inhaler Place 1-2 sprays into the  nose daily.     No facility-administered medications prior to visit.    Allergies  Allergen Reactions  . Other Rash  . Clindamycin/Lincomycin Rash    ROS Review of Systems  Constitutional: Negative for chills and fever.  HENT: Negative for congestion, sinus pressure, sinus pain and sore throat.   Eyes: Negative for pain and discharge.  Respiratory: Negative for cough and shortness of breath.   Cardiovascular: Negative for chest pain and palpitations.  Gastrointestinal: Negative for abdominal pain, constipation, diarrhea, nausea and vomiting.  Endocrine: Negative for polydipsia and polyuria.  Genitourinary: Negative for dysuria and hematuria.  Musculoskeletal: Negative for neck pain and neck stiffness.  Skin:  Negative for rash.  Neurological: Negative for dizziness and weakness.  Psychiatric/Behavioral: Negative for agitation, behavioral problems, decreased concentration, dysphoric mood and sleep disturbance. The patient is nervous/anxious.       Objective:    Physical Exam Vitals reviewed.  Constitutional:      General: She is not in acute distress.    Appearance: She is not diaphoretic.  HENT:     Head: Normocephalic and atraumatic.     Nose: Nose normal.     Mouth/Throat:     Mouth: Mucous membranes are moist.  Eyes:     General: No scleral icterus.    Extraocular Movements: Extraocular movements intact.     Pupils: Pupils are equal, round, and reactive to light.  Neck:     Vascular: No carotid bruit.  Cardiovascular:     Rate and Rhythm: Normal rate and regular rhythm.     Pulses: Normal pulses.     Heart sounds: Normal heart sounds. No murmur heard.   Pulmonary:     Breath sounds: Normal breath sounds. No wheezing or rales.  Abdominal:     Palpations: Abdomen is soft.     Tenderness: There is no abdominal tenderness.  Musculoskeletal:     Cervical back: Neck supple. No tenderness.     Right lower leg: No edema.     Left lower leg: No edema.  Skin:    General: Skin is warm.     Findings: No rash.  Neurological:     General: No focal deficit present.     Mental Status: She is alert and oriented to person, place, and time.     Sensory: No sensory deficit.     Motor: No weakness.  Psychiatric:        Mood and Affect: Mood normal.        Behavior: Behavior normal.     BP (!) 155/87 (BP Location: Left Arm, Patient Position: Sitting)   Pulse 87   Temp 98 F (36.7 C) (Oral)   Resp 18   Ht 5\' 3"  (1.6 m)   Wt 141 lb 12.8 oz (64.3 kg)   SpO2 100%   BMI 25.12 kg/m  Wt Readings from Last 3 Encounters:  07/09/20 141 lb 12.8 oz (64.3 kg)  06/11/20 148 lb 6.4 oz (67.3 kg)  11/14/19 143 lb 3.2 oz (65 kg)     Health Maintenance Due  Topic Date Due  . TETANUS/TDAP   Never done  . INFLUENZA VACCINE  02/23/2020  . MAMMOGRAM  05/11/2020  . COVID-19 Vaccine (3 - Booster for Pfizer series) 05/16/2020    There are no preventive care reminders to display for this patient.  Lab Results  Component Value Date   TSH 2.24 05/29/2019   Lab Results  Component Value Date   WBC 4.1 07/02/2020  HGB 14.8 07/02/2020   HCT 42.8 07/02/2020   MCV 89 07/02/2020   PLT 219 07/02/2020   Lab Results  Component Value Date   NA 144 07/02/2020   K 4.3 07/02/2020   CO2 24 07/02/2020   GLUCOSE 89 07/02/2020   BUN 6 (L) 07/02/2020   CREATININE 0.71 07/02/2020   BILITOT 0.3 07/02/2020   ALKPHOS 73 07/02/2020   AST 19 07/02/2020   ALT 14 07/02/2020   PROT 6.5 07/02/2020   ALBUMIN 4.2 07/02/2020   CALCIUM 9.1 07/02/2020   Lab Results  Component Value Date   CHOL 194 07/02/2020   Lab Results  Component Value Date   HDL 52 07/02/2020   Lab Results  Component Value Date   LDLCALC 129 (H) 07/02/2020   Lab Results  Component Value Date   TRIG 71 07/02/2020   Lab Results  Component Value Date   CHOLHDL 3.7 07/02/2020   Lab Results  Component Value Date   HGBA1C 5.4 07/02/2020      Assessment & Plan:   Problem List Items Addressed This Visit      Annual physical exam - Primary   Annual exam as documented. Counseling done  re healthy lifestyle involving commitment to 150 minutes exercise per week, heart healthy diet, and attaining healthy weight.The importance of adequate sleep also discussed. Changes in health habits are decided on by the patient with goals and time frames  set for achieving them. Immunization and cancer screening needs are specifically addressed at this visit.       Cardiovascular and Mediastinum   Primary hypertension    BP Readings from Last 1 Encounters:  07/09/20 (!) 155/87   Elevated BP on repeated checks Patient states that she has been stressed recently due to her work.  Home BP readings reviewed, labile BP -  120-170/70-90 Has lengthy discussion with patient regarding need for medication, started Lisinopril 5 mg QD. Counseled for compliance with the medications Advised to continue DASH diet and moderate exercise/walking, at least 150 mins/week       Relevant Medications   lisinopril (ZESTRIL) 5 MG tablet     Other         Hyperlipidemia    Lipid profile reviewed Advised to continue to follow DASH diet      Relevant Medications   lisinopril (ZESTRIL) 5 MG tablet      Meds ordered this encounter  Medications  . lisinopril (ZESTRIL) 5 MG tablet    Sig: Take 1 tablet (5 mg total) by mouth daily.    Dispense:  90 tablet    Refill:  1    Follow-up: Return in about 6 months (around 01/07/2021).    Anabel Halon, MD

## 2020-07-09 NOTE — Patient Instructions (Signed)
Please start taking Lisinopril as prescribed.  Please continue to follow DASH diet and perform moderate exercise/walking at least 150 mins/week.  You were given flu vaccine in the office today. It is normal to have local soreness for up to 2 days. Okay to use ice pack locally or take Tylenol for low grade fever/myalgia.  Happy Holidays!

## 2020-07-09 NOTE — Assessment & Plan Note (Signed)
Annual exam as documented. Counseling done  re healthy lifestyle involving commitment to 150 minutes exercise per week, heart healthy diet, and attaining healthy weight.The importance of adequate sleep also discussed. Changes in health habits are decided on by the patient with goals and time frames  set for achieving them. Immunization and cancer screening needs are specifically addressed at this visit. 

## 2020-07-09 NOTE — Assessment & Plan Note (Addendum)
BP Readings from Last 1 Encounters:  07/09/20 (!) 155/87   Elevated BP on repeated checks Patient states that she has been stressed recently due to her work.  Home BP readings reviewed, labile BP - 120-170/70-90 Has lengthy discussion with patient regarding need for medication, started Lisinopril 5 mg QD. Counseled for compliance with the medications Advised to continue DASH diet and moderate exercise/walking, at least 150 mins/week

## 2020-07-09 NOTE — Assessment & Plan Note (Signed)
Lipid profile reviewed Advised to continue to follow DASH diet

## 2020-07-10 ENCOUNTER — Other Ambulatory Visit: Payer: Self-pay

## 2020-07-10 ENCOUNTER — Ambulatory Visit (HOSPITAL_COMMUNITY)
Admission: RE | Admit: 2020-07-10 | Discharge: 2020-07-10 | Disposition: A | Discharge status: Home / Self Care | Payer: Medicare Other | Source: Ambulatory Visit | Attending: Internal Medicine | Admitting: Internal Medicine

## 2020-07-10 DIAGNOSIS — Z1231 Encounter for screening mammogram for malignant neoplasm of breast: Secondary | ICD-10-CM | POA: Diagnosis not present

## 2020-11-13 ENCOUNTER — Telehealth: Payer: Self-pay | Admitting: Internal Medicine

## 2020-11-13 NOTE — Telephone Encounter (Signed)
Left message for patient to call back and schedule Medicare Annual Wellness Visit (AWV) either virtually or in office.   AWV-I PER [place] 04/24/2018  please schedule at anytime with [facility]  health coach  This should be a 40 minute visit.

## 2021-01-05 ENCOUNTER — Telehealth: Payer: Self-pay | Admitting: Internal Medicine

## 2021-01-05 NOTE — Telephone Encounter (Signed)
Left message for patient to call back and schedule Medicare Annual Wellness Visit (AWV) either virtually or in office.   AWV-I PER [place] 04/24/2018 please schedule at anytime with [facility]  health coach  This should be a 40 minute visit.

## 2021-04-01 ENCOUNTER — Ambulatory Visit: Payer: Medicare Other

## 2021-04-12 ENCOUNTER — Telehealth: Payer: Self-pay | Admitting: Internal Medicine

## 2021-04-12 NOTE — Telephone Encounter (Signed)
  Left message for patient to call back and schedule Medicare Annual Wellness Visit (AWV) in office.   If unable to come into the office for AWV,  please offer to do virtually or by telephone.  No hx of AWV eligible for AWVI as of  04/24/2018   Please schedule at anytime with [facility] Health Advisor.      40 Minutes appointment   Any questions, please call me at [phone]

## 2021-08-25 IMAGING — MG DIGITAL SCREENING BILAT W/ TOMO W/ CAD
8 series · 8 of 24 positions shown · non-contrast
Comparison: Previous exam(s).

CLINICAL DATA: Screening.

EXAM:
DIGITAL SCREENING BILATERAL MAMMOGRAM WITH TOMO AND CAD

[R CC synth-2D]
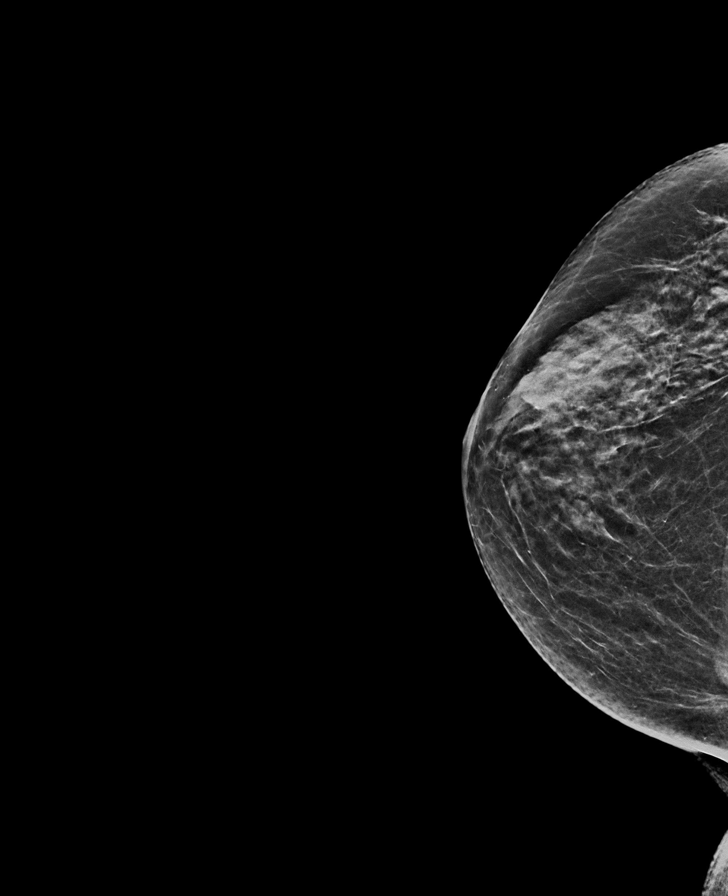

[L MLO synth-2D]
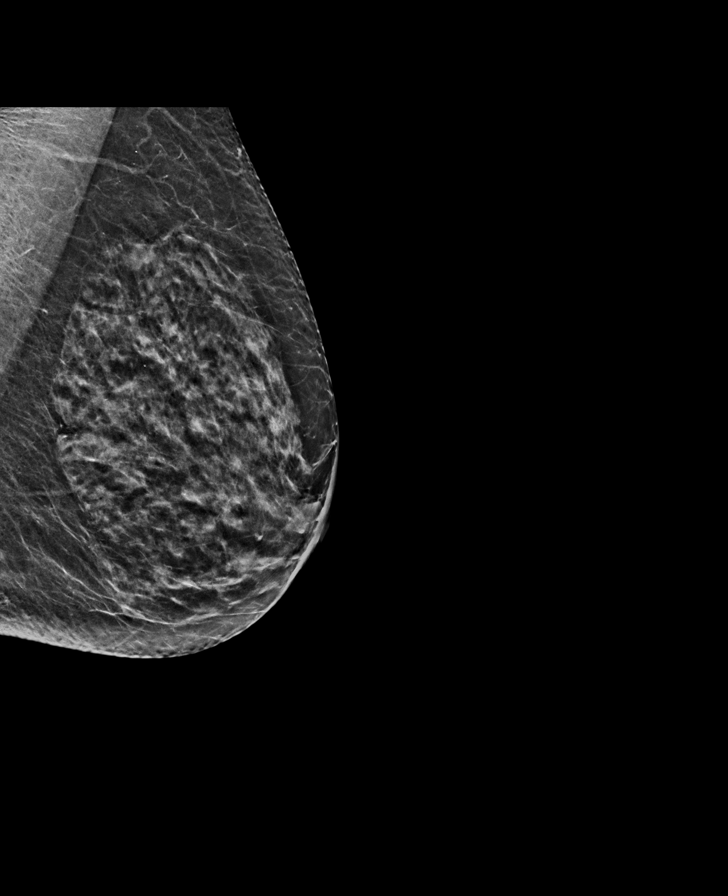

[L CC synth-2D]
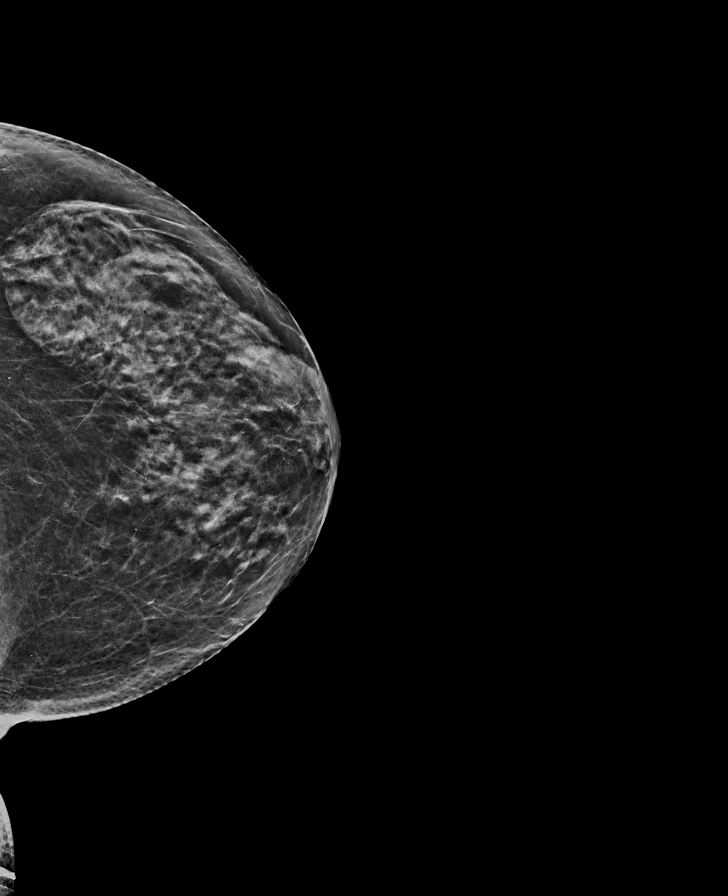

[R MLO synth-2D]
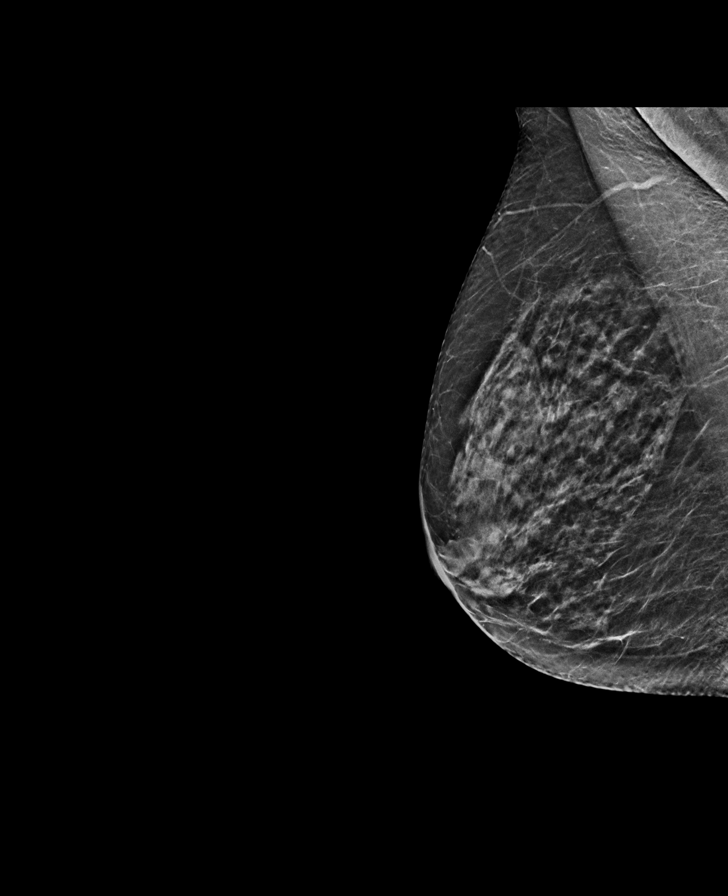

[L CC tomo · tomo slice 29/57.0]
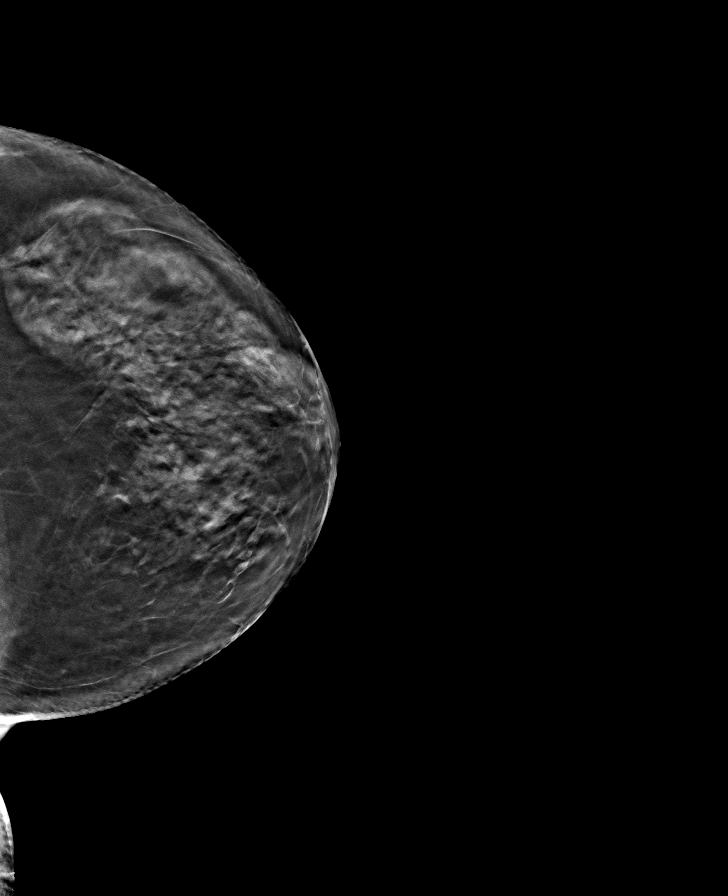

[L MLO tomo · tomo slice 28/55.0]
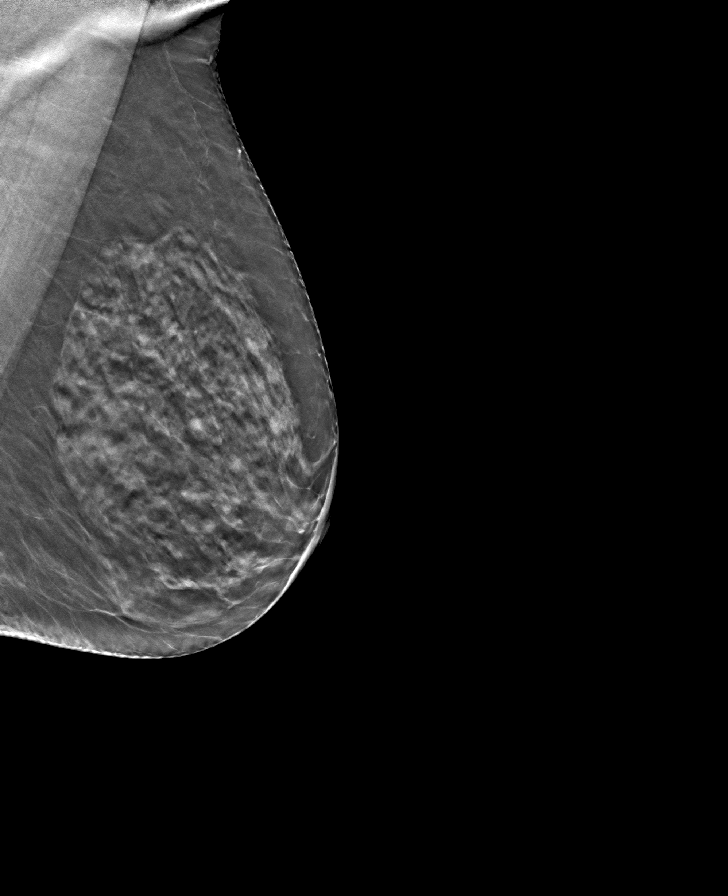

[R MLO tomo · tomo slice 27/53.0]
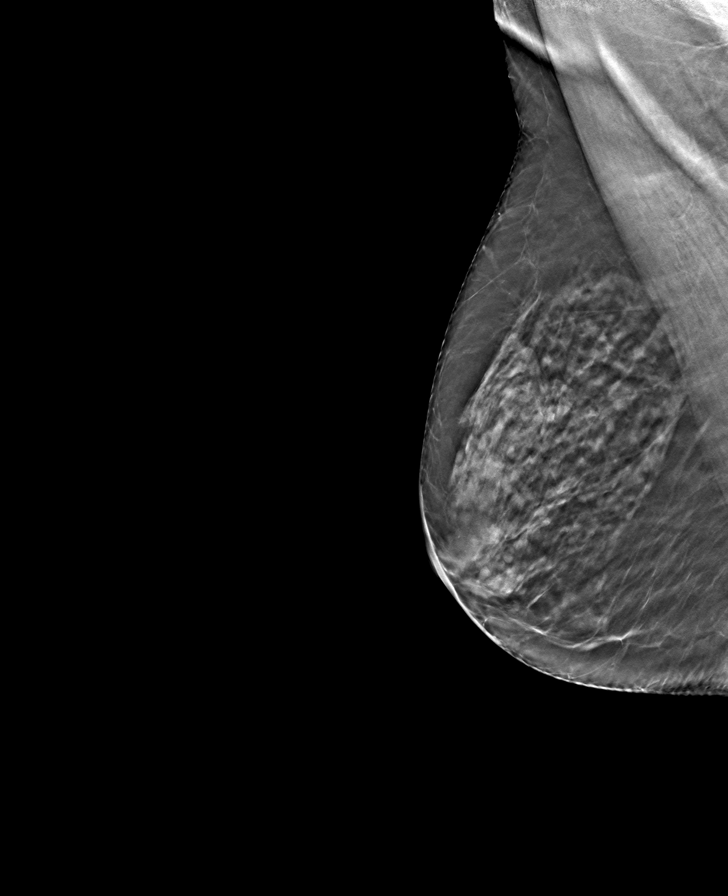

[R CC tomo · tomo slice 28/55.0]
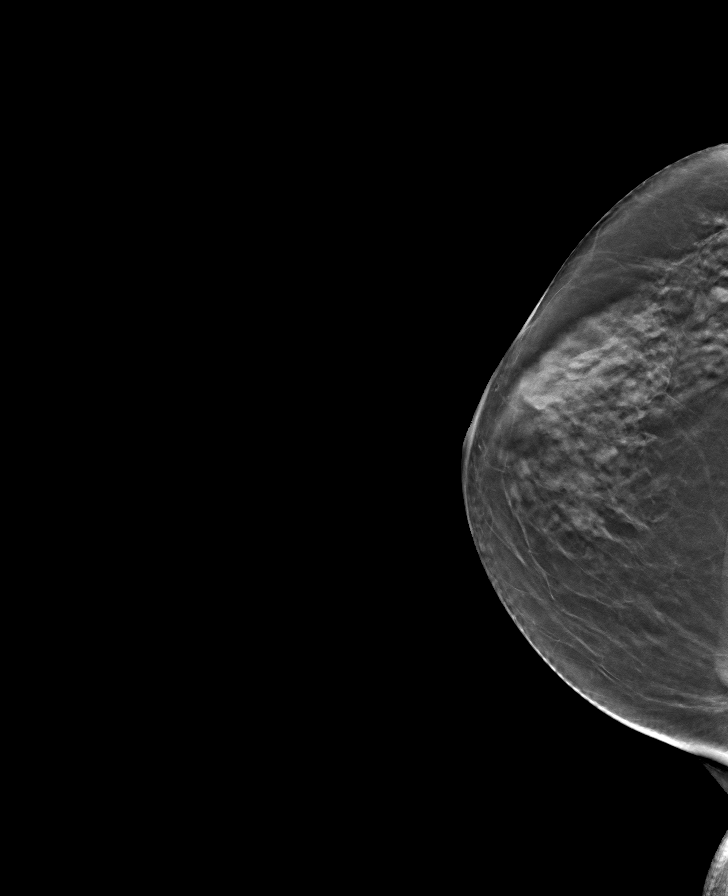

[8 of 24 positions shown; findings below may reference images not displayed]

ACR Breast Density Category c: The breast tissue is heterogeneously
dense, which may obscure small masses.
FINDINGS: There are no findings suspicious for malignancy. Images were
processed with CAD.
IMPRESSION: No mammographic evidence of malignancy. A result letter of this
screening mammogram will be mailed directly to the patient.

RECOMMENDATION:
Screening mammogram in one year. (Code:FT-U-LHB)

BI-RADS CATEGORY  1: Negative.

## 2022-12-26 ENCOUNTER — Encounter (INDEPENDENT_AMBULATORY_CARE_PROVIDER_SITE_OTHER): Payer: Self-pay | Admitting: *Deleted
